# Patient Record
Sex: Male | Born: 1955 | Race: White | Marital: Married | Smoking: Current some day smoker
Health system: Southern US, Community
[De-identification: ages and names within clinical notes are randomized; demographics above are authoritative.]

## PROBLEM LIST (undated history)

## (undated) ENCOUNTER — Emergency Department (HOSPITAL_COMMUNITY): Disposition: A | Discharge status: Home / Self Care | Payer: BLUE CROSS/BLUE SHIELD

---

## 2017-12-05 ENCOUNTER — Other Ambulatory Visit: Payer: Self-pay | Admitting: Urology

## 2017-12-05 ENCOUNTER — Other Ambulatory Visit: Payer: Self-pay

## 2017-12-05 DIAGNOSIS — C61 Malignant neoplasm of prostate: Secondary | ICD-10-CM

## 2017-12-22 ENCOUNTER — Ambulatory Visit
Admission: RE | Admit: 2017-12-22 | Discharge: 2017-12-22 | Disposition: A | Discharge status: Home / Self Care | Payer: BLUE CROSS/BLUE SHIELD | Source: Ambulatory Visit | Attending: Urology | Admitting: Urology

## 2017-12-22 DIAGNOSIS — C61 Malignant neoplasm of prostate: Secondary | ICD-10-CM

## 2017-12-22 MED ORDER — GADOBENATE DIMEGLUMINE 529 MG/ML IV SOLN
16.0000 mL | Freq: Once | INTRAVENOUS | Status: AC | PRN
  Administered 2017-12-22: 16 mL via INTRAVENOUS

## 2019-04-09 IMAGING — MR MR PROSTATE WO/W CM
56 series · 56 of 56 positions shown · IV contrast (Multihance 16ml)
Comparison: None.

CLINICAL DATA: Gleason 3+3=6 lesion in the left mid gland involving
about 30% of submitted tissue on prior biopsy of 05/22/2015.
Elevated PSA level of 5.3.

EXAM:
MR PROSTATE WITHOUT AND WITH CONTRAST
TECHNIQUE: Multiplanar multisequence MRI images were obtained of the pelvis
centered about the prostate. Pre and post contrast images were
obtained.
CONTRAST:  16mL MULTIHANCE GADOBENATE DIMEGLUMINE 529 MG/ML IV SOLN
Creatinine was obtained on site at [HOSPITAL] at [HOSPITAL].
Results: Creatinine 1.1 mg/dL.

[Series 3: T1 · axial · 8.0mm · 1.06mm/px · 1 of 28 slices shown (1 of 2)]
[im 1/28]
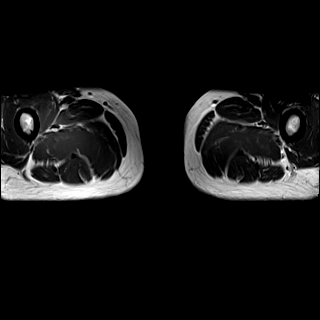

[Series 4: bSSFP fat-sat · axial · 8.0mm · 0.74mm/px · 1 of 28 slices shown]
[im 1/28]
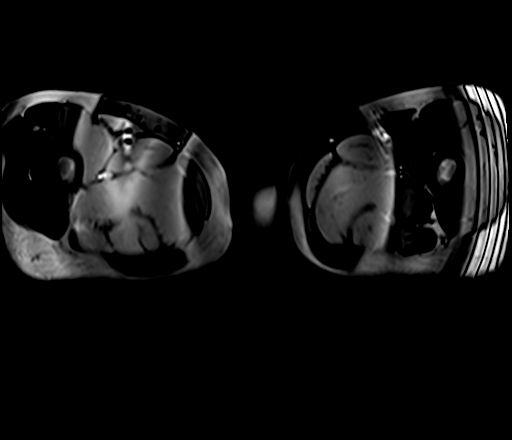

[Series 5: T2 · sagittal · 3.5mm · 0.56mm/px · 1 of 41 slices shown (1 of 4)]
[im 1/41]
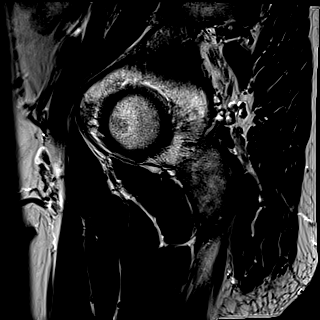

[Series 6: T1 · axial · 3.0mm · 0.31mm/px · 1 of 28 slices shown (2 of 2)]
[im 1/28]
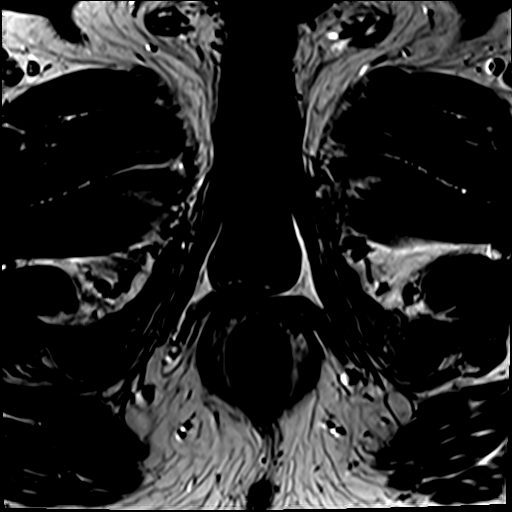

[Series 7: T2 · axial · 3.5mm · 0.56mm/px · 1 of 25 slices shown (2 of 4)]
[im 1/25]
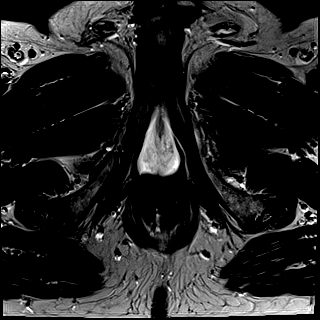

[Series 8: T2 · axial · 1.0mm · 1.04mm/px · 1 of 96 slices shown (3 of 4)]
[im 1/96]
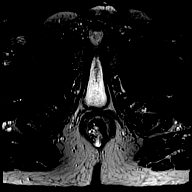

[Series 9: T2 · coronal · 3.5mm · 0.56mm/px · 1 of 31 slices shown (4 of 4)]
[im 1/31]
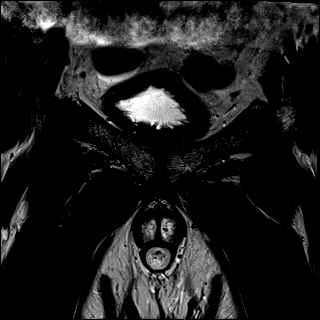

[Series 10: DWI · axial · 3.5mm · 1.56mm/px · 1 of 61 slices shown (1 of 2)]
[im 1/61]
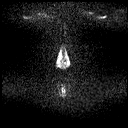

[Series 11: DWI · axial · 3.5mm · 1.56mm/px · 1 of 18 slices shown (2 of 2)]
[im 1/18]
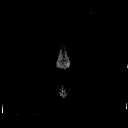

[Series 12: pre t1_twist_tra_dyn_ttc=6.4s · axial · non-contrast · 3.5mm · 0.83mm/px · 1 of 24 slices shown]
[im 1/24]
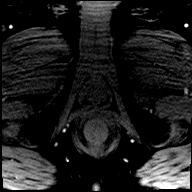

[Series 13: post t1_twist_tra_dyn-copy center · axial · 3.5mm · 0.83mm/px · 1 of 24 slices shown (1 of 24)]
[im 1/24]
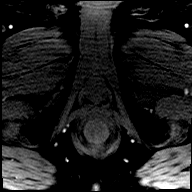

[Series 14: post t1_twist_tra_dyn-copy center · axial · 3.5mm · 0.83mm/px · 1 of 24 slices shown (2 of 24)]
[im 1/24]
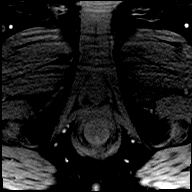

[Series 15: post t1_twist_tra_dyn-copy cent_sub_ttc=28.4s · axial · 3.5mm · 0.83mm/px · 1 of 24 slices shown]
[im 1/24]
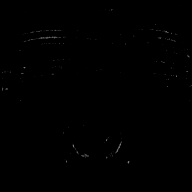

[Series 16: post t1_twist_tra_dyn-copy center · axial · 3.5mm · 0.83mm/px · 1 of 24 slices shown (3 of 24)]
[im 1/24]
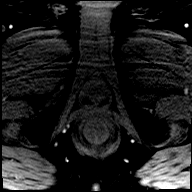

[Series 17: post t1_twist_tra_dyn-copy cent_sub_ttc=41.2s · axial · 3.5mm · 0.83mm/px · 1 of 22 slices shown]
[im 1/22]
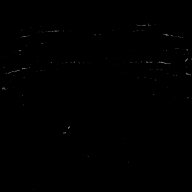

[Series 18: post t1_twist_tra_dyn-copy center · axial · 3.5mm · 0.83mm/px · 1 of 24 slices shown (4 of 24)]
[im 1/24]
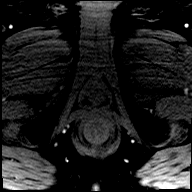

[Series 19: post t1_twist_tra_dyn-copy cent_sub_ttc=54.0s · axial · 3.5mm · 0.83mm/px · 1 of 24 slices shown]
[im 1/24]
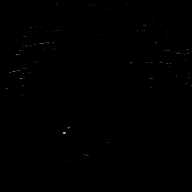

[Series 20: post t1_twist_tra_dyn-copy center · axial · 3.5mm · 0.83mm/px · 1 of 24 slices shown (5 of 24)]
[im 1/24]
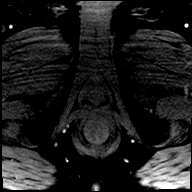

[Series 21: post t1_twist_tra_dyn-copy cent_sub_ttc=66.8s · axial · 3.5mm · 0.83mm/px · 1 of 24 slices shown]
[im 1/24]
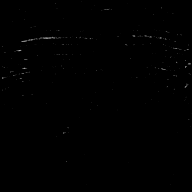

[Series 22: post t1_twist_tra_dyn-copy center · axial · 3.5mm · 0.83mm/px · 1 of 24 slices shown (6 of 24)]
[im 1/24]
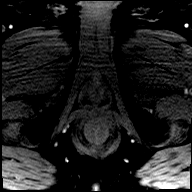

[Series 23: post t1_twist_tra_dyn-copy cent_sub_ttc=79.6s · axial · 3.5mm · 0.83mm/px · 1 of 24 slices shown]
[im 1/24]
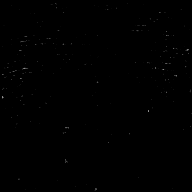

[Series 24: post t1_twist_tra_dyn-copy center · axial · 3.5mm · 0.83mm/px · 1 of 24 slices shown (7 of 24)]
[im 1/24]
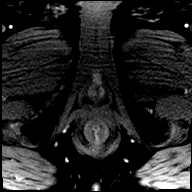

[Series 25: post t1_twist_tra_dyn-copy cent_sub_ttc=92.4s · axial · 3.5mm · 0.83mm/px · 1 of 24 slices shown]
[im 1/24]
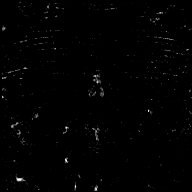

[Series 26: post t1_twist_tra_dyn-copy center · axial · 3.5mm · 0.83mm/px · 1 of 24 slices shown (8 of 24)]
[im 1/24]
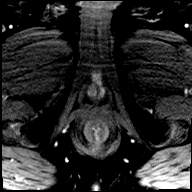

[Series 27: post t1_twist_tra_dyn-copy cent_sub_ttc=105.2s · axial · 3.5mm · 0.83mm/px · 1 of 24 slices shown]
[im 1/24]
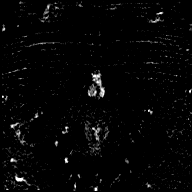

[Series 28: post t1_twist_tra_dyn-copy center · axial · 3.5mm · 0.83mm/px · 1 of 24 slices shown (9 of 24)]
[im 1/24]
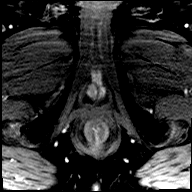

[Series 29: post t1_twist_tra_dyn-copy cent_sub_ttc=117.9s · axial · 3.5mm · 0.83mm/px · 1 of 23 slices shown]
[im 1/23]
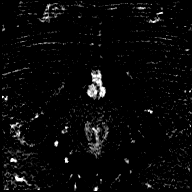

[Series 30: post t1_twist_tra_dyn-copy center · axial · 3.5mm · 0.83mm/px · 1 of 24 slices shown (10 of 24)]
[im 1/24]
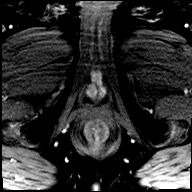

[Series 31: post t1_twist_tra_dyn-copy cent_sub_ttc=130.7s · axial · 3.5mm · 0.83mm/px · 1 of 24 slices shown]
[im 1/24]
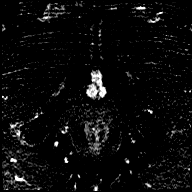

[Series 32: post t1_twist_tra_dyn-copy center · axial · 3.5mm · 0.83mm/px · 1 of 24 slices shown (11 of 24)]
[im 1/24]
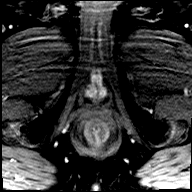

[Series 33: post t1_twist_tra_dyn-copy cent_sub_ttc=143.5s · axial · 3.5mm · 0.83mm/px · 1 of 24 slices shown]
[im 1/24]
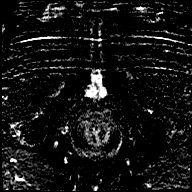

[Series 34: post t1_twist_tra_dyn-copy center · axial · 3.5mm · 0.83mm/px · 1 of 24 slices shown (12 of 24)]
[im 1/24]
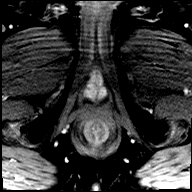

[Series 35: post t1_twist_tra_dyn-copy cent_sub_ttc=156.3s · axial · 3.5mm · 0.83mm/px · 1 of 24 slices shown]
[im 1/24]
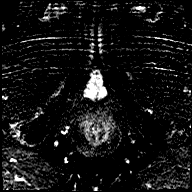

[Series 36: post t1_twist_tra_dyn-copy center · axial · 3.5mm · 0.83mm/px · 1 of 24 slices shown (13 of 24)]
[im 1/24]
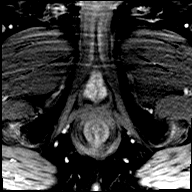

[Series 37: post t1_twist_tra_dyn-copy cent_sub_ttc=169.1s · axial · 3.5mm · 0.83mm/px · 1 of 23 slices shown]
[im 1/23]
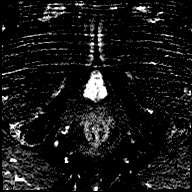

[Series 38: post t1_twist_tra_dyn-copy center · axial · 3.5mm · 0.83mm/px · 1 of 24 slices shown (14 of 24)]
[im 1/24]
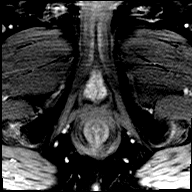

[Series 39: post t1_twist_tra_dyn-copy cent_sub_ttc=181.9s · axial · 3.5mm · 0.83mm/px · 1 of 24 slices shown]
[im 1/24]
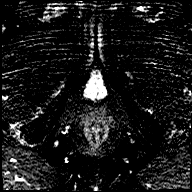

[Series 40: post t1_twist_tra_dyn-copy center · axial · 3.5mm · 0.83mm/px · 1 of 24 slices shown (15 of 24)]
[im 1/24]
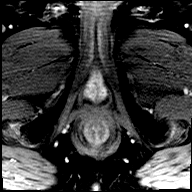

[Series 41: post t1_twist_tra_dyn-copy cent_sub_ttc=194.7s · axial · 3.5mm · 0.83mm/px · 1 of 24 slices shown]
[im 1/24]
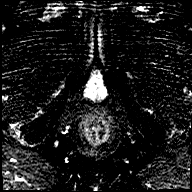

[Series 42: post t1_twist_tra_dyn-copy center · axial · 3.5mm · 0.83mm/px · 1 of 24 slices shown (16 of 24)]
[im 1/24]
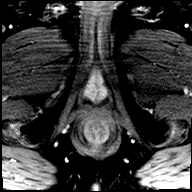

[Series 43: post t1_twist_tra_dyn-copy cent_sub_ttc=207.5s · axial · 3.5mm · 0.83mm/px · 1 of 24 slices shown]
[im 1/24]
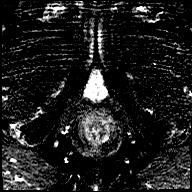

[Series 44: post t1_twist_tra_dyn-copy center · axial · 3.5mm · 0.83mm/px · 1 of 24 slices shown (17 of 24)]
[im 1/24]
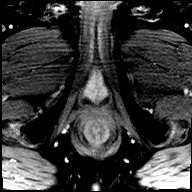

[Series 45: post t1_twist_tra_dyn-copy cent_sub_ttc=220.3s · axial · 3.5mm · 0.83mm/px · 1 of 24 slices shown]
[im 1/24]
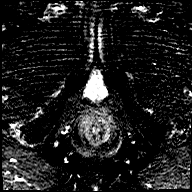

[Series 46: post t1_twist_tra_dyn-copy center · axial · 3.5mm · 0.83mm/px · 1 of 24 slices shown (18 of 24)]
[im 1/24]
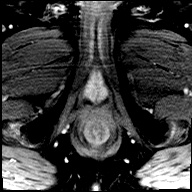

[Series 47: post t1_twist_tra_dyn-copy cent_sub_ttc=233.1s · axial · 3.5mm · 0.83mm/px · 1 of 24 slices shown]
[im 1/24]
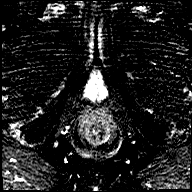

[Series 48: post t1_twist_tra_dyn-copy center · axial · 3.5mm · 0.83mm/px · 1 of 24 slices shown (19 of 24)]
[im 1/24]
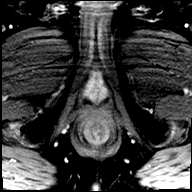

[Series 49: post t1_twist_tra_dyn-copy cent_sub_ttc=245.9s · axial · 3.5mm · 0.83mm/px · 1 of 24 slices shown]
[im 1/24]
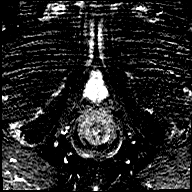

[Series 50: post t1_twist_tra_dyn-copy center · axial · 3.5mm · 0.83mm/px · 1 of 24 slices shown (20 of 24)]
[im 1/24]
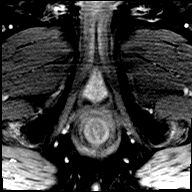

[Series 51: post t1_twist_tra_dyn-copy cent_sub_ttc=258.6s · axial · 3.5mm · 0.83mm/px · 1 of 24 slices shown]
[im 1/24]
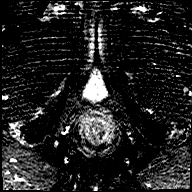

[Series 52: post t1_twist_tra_dyn-copy center · axial · 3.5mm · 0.83mm/px · 1 of 24 slices shown (21 of 24)]
[im 1/24]
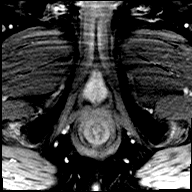

[Series 53: post t1_twist_tra_dyn-copy cent_sub_ttc=271.4s · axial · 3.5mm · 0.83mm/px · 1 of 24 slices shown]
[im 1/24]
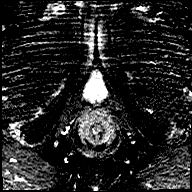

[Series 54: post t1_twist_tra_dyn-copy center · axial · 3.5mm · 0.83mm/px · 1 of 24 slices shown (22 of 24)]
[im 1/24]
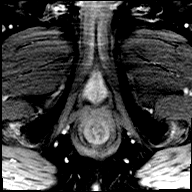

[Series 55: post t1_twist_tra_dyn-copy cent_sub_ttc=284.2s · axial · 3.5mm · 0.83mm/px · 1 of 24 slices shown]
[im 1/24]
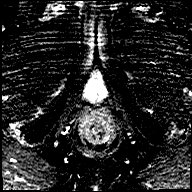

[Series 56: post t1_twist_tra_dyn-copy center · axial · 3.5mm · 0.83mm/px · 1 of 24 slices shown (23 of 24)]
[im 1/24]
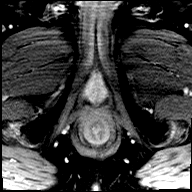

[Series 57: post t1_twist_tra_dyn-copy cent_sub_ttc=297.0s · axial · 3.5mm · 0.83mm/px · 1 of 24 slices shown]
[im 1/24]
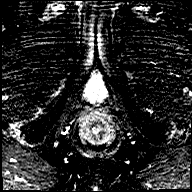

[Series 58: post t1_twist_tra_dyn-copy center · axial · 3.5mm · 0.83mm/px · 1 of 24 slices shown (24 of 24)]
[im 1/24]
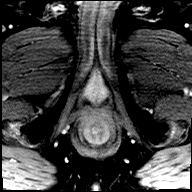

[56 of 56 positions shown; findings below may reference images not displayed]

FINDINGS: Prostate: Gas in the rectum is causing significant distortion and
artifact on the ADC map and diffusion images, and as a result,
increased emphasis is placed on the T2 weighted images in assessing
the peripheral zone.

A PI-RADS category 3 lesion of the left posterolateral mid gland and
left posterior base peripheral zone has mildly reduced indistinctly
marginated T2 signal characteristics but no appreciable dynamic
contrast enhancement. This lesion measures 1.9 by 1.4 by 0.7 cm and
targeting data is under region of interest # 1.

A PI-RADS category 3 lesion of the right posterior peripheral zone
at the base and right posterolateral peripheral zone in the mid
gland likewise has reduced T2 signal characteristics without
appreciable early contrast enhancement. This measures 0.61 cubic cm
(1.5 by 1.1 by 0.7 cm) and is labeled region of interest # 2 for
targeting purposes.

There is some subtle accentuated early contrast enhancement at the
left lateral apex peripheral zone but this does not have associated
focal reduced T2 signal and thus does not qualify as a specific
lesion.

Volume: 3D volumetric analysis: 42.10 cubic cm (5.5 by 4.0 by
cm).

Transcapsular spread:  Absent

Seminal vesicle involvement: Absent

Neurovascular bundle involvement: Absent

Pelvic adenopathy: Absent

Bone metastasis: Absent

Other findings: No supplemental non-categorized findings.
IMPRESSION: 1. PI-RADS category 3 lesions are present in the right
posterolateral and left posterolateral peripheral zone involving the
mid gland and prostate base, with targeting data regions of interest
saved to [facility] as noted above.
2. The prostate gland measures 42.1 cubic cm.
3. No adenopathy or evidence of bony metastatic disease.

## 2021-07-10 ENCOUNTER — Other Ambulatory Visit (HOSPITAL_COMMUNITY): Payer: Self-pay | Admitting: Cardiovascular Disease

## 2021-07-10 ENCOUNTER — Other Ambulatory Visit: Payer: Self-pay | Admitting: Cardiovascular Disease

## 2021-07-10 DIAGNOSIS — R0602 Shortness of breath: Secondary | ICD-10-CM

## 2021-07-20 ENCOUNTER — Telehealth (HOSPITAL_COMMUNITY): Payer: Self-pay | Admitting: Emergency Medicine

## 2021-07-20 NOTE — Telephone Encounter (Signed)
Reaching out to patient to offer assistance regarding upcoming cardiac imaging study; pt verbalizes understanding of appt date/time, parking situation and where to check in, pre-test NPO status and medications ordered, and verified current allergies; name and call back number provided for further questions should they arise ?Marchia Bond RN Navigator Cardiac Imaging ?[facility] ?[phone] office ?[phone] cell ? ? ?Arrival 830 ?Daily meds ? ?

## 2021-07-23 ENCOUNTER — Ambulatory Visit (HOSPITAL_COMMUNITY)
Admission: RE | Admit: 2021-07-23 | Discharge: 2021-07-23 | Disposition: A | Discharge status: Home / Self Care | Payer: Medicare HMO | Source: Ambulatory Visit | Attending: Family Medicine | Admitting: Family Medicine

## 2021-07-23 DIAGNOSIS — R0602 Shortness of breath: Secondary | ICD-10-CM | POA: Insufficient documentation

## 2021-07-23 DIAGNOSIS — I2510 Atherosclerotic heart disease of native coronary artery without angina pectoris: Secondary | ICD-10-CM | POA: Insufficient documentation

## 2021-07-23 MED ORDER — IOHEXOL 350 MG/ML SOLN
100.0000 mL | Freq: Once | INTRAVENOUS | Status: AC | PRN
Start: 2021-07-23 — End: 2021-07-23
  Administered 2021-07-23: 100 mL via INTRAVENOUS

## 2021-07-23 MED ORDER — NITROGLYCERIN 0.4 MG SL SUBL
SUBLINGUAL_TABLET | SUBLINGUAL | Status: AC
  Filled 2021-07-23: qty 2

## 2021-07-23 MED ORDER — NITROGLYCERIN 0.4 MG SL SUBL
0.8000 mg | SUBLINGUAL_TABLET | Freq: Once | SUBLINGUAL | Status: AC
  Administered 2021-07-23: 0.8 mg via SUBLINGUAL

## 2021-08-03 ENCOUNTER — Telehealth: Payer: Self-pay

## 2021-08-03 NOTE — Telephone Encounter (Signed)
NOTES SCANNED TO REFERRAL 

## 2021-08-14 ENCOUNTER — Encounter: Payer: Self-pay | Admitting: Cardiovascular Disease

## 2021-08-14 ENCOUNTER — Ambulatory Visit: Payer: Medicare HMO | Admitting: Cardiovascular Disease

## 2021-08-14 ENCOUNTER — Other Ambulatory Visit: Payer: Self-pay

## 2021-08-14 DIAGNOSIS — I2510 Atherosclerotic heart disease of native coronary artery without angina pectoris: Secondary | ICD-10-CM | POA: Insufficient documentation

## 2021-08-14 DIAGNOSIS — I10 Essential (primary) hypertension: Secondary | ICD-10-CM | POA: Diagnosis not present

## 2021-08-14 DIAGNOSIS — E782 Mixed hyperlipidemia: Secondary | ICD-10-CM | POA: Diagnosis not present

## 2021-08-14 DIAGNOSIS — E785 Hyperlipidemia, unspecified: Secondary | ICD-10-CM | POA: Insufficient documentation

## 2021-08-14 LAB — BASIC METABOLIC PANEL
BUN/Creatinine Ratio: 16 (ref 10–24)
BUN: 17 mg/dL (ref 8–27)
CO2: 22 mmol/L (ref 20–29)
Calcium: 10.2 mg/dL (ref 8.6–10.2)
Chloride: 100 mmol/L (ref 96–106)
Creatinine, Ser: 1.09 mg/dL (ref 0.76–1.27)
Glucose: 99 mg/dL (ref 70–99)
Potassium: 4.6 mmol/L (ref 3.5–5.2)
Sodium: 139 mmol/L (ref 134–144)
eGFR: 74 mL/min/{1.73_m2} (ref >59)

## 2021-08-14 LAB — CBC
Hematocrit: 40.4 % (ref 37.5–51.0)
Hemoglobin: 13.8 g/dL (ref 13.0–17.7)
MCH: 30.9 pg (ref 26.6–33.0)
MCHC: 34.2 g/dL (ref 31.5–35.7)
MCV: 91 fL (ref 79–97)
Platelets: 196 10*3/uL (ref 150–450)
RBC: 4.46 x10E6/uL (ref 4.14–5.80)
RDW: 12.7 % (ref 11.6–15.4)
WBC: 5.6 10*3/uL (ref 3.4–10.8)

## 2021-08-14 MED ORDER — SODIUM CHLORIDE 0.9% FLUSH: 3.0000 mL | Freq: Two times a day (BID) | INTRAVENOUS | Status: AC

## 2021-08-14 NOTE — Patient Instructions (Signed)
Medication Instructions:  Your physician recommends that you continue on your current medications as directed. Please refer to the Current Medication list given to you today.  *If you need a refill on your cardiac medications before your next appointment, please call your pharmacy*   Lab Work: Your physician recommends that you have labs drawn today: BMET & CBC  If you have labs (blood work) drawn today and your tests are completely normal, you will receive your results only by: [place] (if you have MyChart) OR A paper copy in the mail If you have any lab test that is abnormal or we need to change your treatment, we will call you to review the results.   Testing/Procedures: See below   Follow-Up: At [facility], you and your health needs are our priority.  As part of our continuing mission to provide you with exceptional heart care, we have created designated Provider Care Teams.  These Care Teams include your primary Cardiologist (physician) and Advanced Practice Providers (APPs -  Physician Assistants and Nurse Practitioners) who all work together to provide you with the care you need, when you need it.  We recommend signing up for the patient portal called "MyChart".  Sign up information is provided on this After Visit Summary.  MyChart is used to connect with patients for Virtual Visits (Telemedicine).  Patients are able to view lab/test results, encounter notes, upcoming appointments, etc.  Non-urgent messages can be sent to your provider as well.   To learn more about what you can do with MyChart, go to [email or web address].    Your next appointment:   2 week(s) after your procedure (6/1)  The format for your next appointment:   In Person  Provider:   None     Other Instructions  [address] [place] [address] Dept: [phone] Loc:  [place]  08/14/2021  You are scheduled for a Cardiac Catheterization on Thursday, June 1 with Dr. Quay Burow.  1. Please arrive at the Main Entrance A at [facility]: [place],  [address] at 9:30 AM (This time is two hours before your procedure to ensure your preparation). Free valet parking service is available.   Special note: Every effort is made to have your procedure done on time. Please understand that emergencies sometimes delay scheduled procedures.  2. Diet: Do not eat solid foods after midnight.  You may have clear liquids until 5 AM upon the day of the procedure.  3. Labs: You will need to have blood drawn today.   4. Medication instructions in preparation for your procedure:  On the morning of your procedure, take Aspirin and any morning medicines NOT listed above.  You may use sips of water.  5. Plan to go home the same day, you will only stay overnight if medically necessary. 6. You MUST have a responsible adult to drive you home. 7. An adult MUST be with you the first 24 hours after you arrive home. 8. Bring a current list of your medications, and the last time and date medication taken. 9. Bring ID and current insurance cards. 10.Please wear clothes that are easy to get on and off and wear slip-on shoes.  Thank you for allowing [place] to care for you!   -- [facility]

## 2021-08-14 NOTE — Assessment & Plan Note (Signed)
Xavier Carter was referred to me by Dr. Claudie Leach for cardiac catheterization because of a recent coronary CTA suggested a high-grade proximal LAD stenosis.  He does have risk factors notable for treated hypertension, diabetes and hyperlipidemia.  He smoked remotely.  He is completely asymptomatic.  He apparently had a normal nuclear stress test and 2D echo but a coronary CTA revealed a coronary calcium score of 427 with a severe proximal LAD stenosis.  Given the location I feel compelled to proceed with outpatient radial diagnostic coronary angiography.  I have reviewed the risks, indications, and alternatives to cardiac catheterization, possible angioplasty, and stenting with the patient. Risks include but are not limited to bleeding, infection, vascular injury, stroke, myocardial infection, arrhythmia, kidney injury, radiation-related injury in the case of prolonged fluoroscopy use, emergency cardiac surgery, and death. The patient understands the risks of serious complication is 1-2 in 5701 with diagnostic cardiac cath and 1-2% or less with angioplasty/stenting.

## 2021-08-14 NOTE — H&P (View-Only) (Signed)
   08/14/2021 Xavier Carter   10/02/1955  [id number]  Primary Physician Hufnagel, Christine, NP Primary Cardiologist: Jonathan J Berry MD FACP, FACC, FAHA, FSCAI  HPI:  Xavier Carter is a 66 y.o. thin-appearing married Latino male father of 2, grandfather of 2 grandchildren referred by Dr. Rosario, his cardiologist, for cardiac catheterization because of an abnormal coronary CTA.  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  This risk factors include remote tobacco abuse, treated hypertension, diabetes and hyperlipidemia.  Is never had heart attack or stroke.  He denies chest pain or shortness of breath.  He recently had a Myoview stress test that was low risk and nonischemic and a 2D echo that was normal as well.  He did have a coronary CTA that revealed a coronary calcium score of 427 with what appeared to be a high-grade lesion in his proximal LAD.  Because of the lesion location he was referred for diagnostic coronary angiography.   Current Meds  Medication Sig   amLODipine (NORVASC) 10 MG tablet Take 10 mg by mouth daily.   atorvastatin (LIPITOR) 20 MG tablet Take 1 tablet by mouth daily.   Blood Glucose Monitoring Suppl (ONE TOUCH ULTRA 2) w/Device KIT by Does not apply route.   carvedilol (COREG) 3.125 MG tablet Take 3.125 mg by mouth daily.   dapagliflozin propanediol (FARXIGA) 10 MG TABS tablet Take 1 tablet by mouth daily.   finasteride (PROSCAR) 5 MG tablet Take by mouth.   hydrocortisone (ANUSOL-HC) 2.5 % rectal cream Place rectally 2 (two) times daily.   Lancets (ONETOUCH DELICA PLUS LANCET33G) MISC daily.   Omega-3 Fatty Acids (FISH OIL) 1000 MG CAPS Take by mouth.   ONETOUCH ULTRA test strip 1 (ONE) STRIP ONCE A DAY   OZEMPIC, 0.25 OR 0.5 MG/DOSE, 2 MG/1.5ML SOPN Apply topically.   Vitamin D, Ergocalciferol, (DRISDOL) 1.25 MG (50000 UNIT) CAPS capsule Take 50,000 Units by mouth once a week.     Allergies  Allergen Reactions    Penicillins Hives, Other (See Comments) and Rash    Social History   Socioeconomic History   Marital status: Married    Spouse name: Not on file   Number of children: Not on file   Years of education: Not on file   Highest education level: Not on file  Occupational History   Not on file  Tobacco Use   Smoking status: Some Days    Types: Cigars   Smokeless tobacco: Never  Substance and Sexual Activity   Alcohol use: Not Currently    Comment: social settings   Drug use: Never   Sexual activity: Not on file  Other Topics Concern   Not on file  Social History Narrative   Not on file   Social Determinants of Health   Financial Resource Strain: Not on file  Food Insecurity: Not on file  Transportation Needs: Not on file  Physical Activity: Not on file  Stress: Not on file  Social Connections: Not on file  Intimate Partner Violence: Not on file     Review of Systems: General: negative for chills, fever, night sweats or weight changes.  Cardiovascular: negative for chest pain, dyspnea on exertion, edema, orthopnea, palpitations, paroxysmal nocturnal dyspnea or shortness of breath Dermatological: negative for rash Respiratory: negative for cough or wheezing Urologic: negative for hematuria Abdominal: negative for nausea, vomiting, diarrhea, bright red blood per rectum, melena, or hematemesis Neurologic: negative for visual changes, syncope, or dizziness All   other systems reviewed and are otherwise negative except as noted above.    Blood pressure 134/72, pulse (!) 56, height 5' 11" (1.803 m), weight 170 lb 6.4 oz (77.3 kg).  General appearance: alert and no distress Neck: no adenopathy, no carotid bruit, no JVD, supple, symmetrical, trachea midline, and thyroid not enlarged, symmetric, no tenderness/mass/nodules Lungs: clear to auscultation bilaterally Heart: regular rate and rhythm, S1, S2 normal, no murmur, click, rub or gallop Extremities: extremities normal,  atraumatic, no cyanosis or edema Pulses: 2+ and symmetric Skin: Skin color, texture, turgor normal. No rashes or lesions Neurologic: Grossly normal  EKG sinus bradycardia 56 without ST or T wave changes.  Personally reviewed this EKG.  ASSESSMENT AND PLAN:   Coronary artery disease Xavier Carter was referred to me by Dr. Rosario for cardiac catheterization because of a recent coronary CTA suggested a high-grade proximal LAD stenosis.  He does have risk factors notable for treated hypertension, diabetes and hyperlipidemia.  He smoked remotely.  He is completely asymptomatic.  He apparently had a normal nuclear stress test and 2D echo but a coronary CTA revealed a coronary calcium score of 427 with a severe proximal LAD stenosis.  Given the location I feel compelled to proceed with outpatient radial diagnostic coronary angiography.   I have reviewed the risks, indications, and alternatives to cardiac catheterization, possible angioplasty, and stenting with the patient. Risks include but are not limited to bleeding, infection, vascular injury, stroke, myocardial infection, arrhythmia, kidney injury, radiation-related injury in the case of prolonged fluoroscopy use, emergency cardiac surgery, and death. The patient understands the risks of serious complication is 1-2 in 1000 with diagnostic cardiac cath and 1-2% or less with angioplasty/stenting.      Jonathan J. Berry MD FACP,FACC,FAHA, FSCAI 08/14/2021 10:17 AM 

## 2021-08-14 NOTE — Progress Notes (Signed)
   08/14/2021 Xavier Carter   07/05/1955  [id number]  Primary Physician Hufnagel, Christine, NP Primary Cardiologist: Lavarr President J Dev Dhondt MD FACP, FACC, FAHA, FSCAI  HPI:  Xavier Carter is a 66 y.o. thin-appearing married Latino male father of 2, grandfather of 2 grandchildren referred by Dr. Rosario, his cardiologist, for cardiac catheterization because of an abnormal coronary CTA.  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  This risk factors include remote tobacco abuse, treated hypertension, diabetes and hyperlipidemia.  Is never had heart attack or stroke.  He denies chest pain or shortness of breath.  He recently had a Myoview stress test that was low risk and nonischemic and a 2D echo that was normal as well.  He did have a coronary CTA that revealed a coronary calcium score of 427 with what appeared to be a high-grade lesion in his proximal LAD.  Because of the lesion location he was referred for diagnostic coronary angiography.   Current Meds  Medication Sig   amLODipine (NORVASC) 10 MG tablet Take 10 mg by mouth daily.   atorvastatin (LIPITOR) 20 MG tablet Take 1 tablet by mouth daily.   Blood Glucose Monitoring Suppl (ONE TOUCH ULTRA 2) w/Device KIT by Does not apply route.   carvedilol (COREG) 3.125 MG tablet Take 3.125 mg by mouth daily.   dapagliflozin propanediol (FARXIGA) 10 MG TABS tablet Take 1 tablet by mouth daily.   finasteride (PROSCAR) 5 MG tablet Take by mouth.   hydrocortisone (ANUSOL-HC) 2.5 % rectal cream Place rectally 2 (two) times daily.   Lancets (ONETOUCH DELICA PLUS LANCET33G) MISC daily.   Omega-3 Fatty Acids (FISH OIL) 1000 MG CAPS Take by mouth.   ONETOUCH ULTRA test strip 1 (ONE) STRIP ONCE A DAY   OZEMPIC, 0.25 OR 0.5 MG/DOSE, 2 MG/1.5ML SOPN Apply topically.   Vitamin D, Ergocalciferol, (DRISDOL) 1.25 MG (50000 UNIT) CAPS capsule Take 50,000 Units by mouth once a week.     Allergies  Allergen Reactions    Penicillins Hives, Other (See Comments) and Rash    Social History   Socioeconomic History   Marital status: Married    Spouse name: Not on file   Number of children: Not on file   Years of education: Not on file   Highest education level: Not on file  Occupational History   Not on file  Tobacco Use   Smoking status: Some Days    Types: Cigars   Smokeless tobacco: Never  Substance and Sexual Activity   Alcohol use: Not Currently    Comment: social settings   Drug use: Never   Sexual activity: Not on file  Other Topics Concern   Not on file  Social History Narrative   Not on file   Social Determinants of Health   Financial Resource Strain: Not on file  Food Insecurity: Not on file  Transportation Needs: Not on file  Physical Activity: Not on file  Stress: Not on file  Social Connections: Not on file  Intimate Partner Violence: Not on file     Review of Systems: General: negative for chills, fever, night sweats or weight changes.  Cardiovascular: negative for chest pain, dyspnea on exertion, edema, orthopnea, palpitations, paroxysmal nocturnal dyspnea or shortness of breath Dermatological: negative for rash Respiratory: negative for cough or wheezing Urologic: negative for hematuria Abdominal: negative for nausea, vomiting, diarrhea, bright red blood per rectum, melena, or hematemesis Neurologic: negative for visual changes, syncope, or dizziness All   other systems reviewed and are otherwise negative except as noted above.    Blood pressure 134/72, pulse (!) 56, height 5' 11" (1.803 m), weight 170 lb 6.4 oz (77.3 kg).  General appearance: alert and no distress Neck: no adenopathy, no carotid bruit, no JVD, supple, symmetrical, trachea midline, and thyroid not enlarged, symmetric, no tenderness/mass/nodules Lungs: clear to auscultation bilaterally Heart: regular rate and rhythm, S1, S2 normal, no murmur, click, rub or gallop Extremities: extremities normal,  atraumatic, no cyanosis or edema Pulses: 2+ and symmetric Skin: Skin color, texture, turgor normal. No rashes or lesions Neurologic: Grossly normal  EKG sinus bradycardia 56 without ST or T wave changes.  Personally reviewed this EKG.  ASSESSMENT AND PLAN:   Coronary artery disease Mr. Gurski was referred to me by Dr. Rosario for cardiac catheterization because of a recent coronary CTA suggested a high-grade proximal LAD stenosis.  He does have risk factors notable for treated hypertension, diabetes and hyperlipidemia.  He smoked remotely.  He is completely asymptomatic.  He apparently had a normal nuclear stress test and 2D echo but a coronary CTA revealed a coronary calcium score of 427 with a severe proximal LAD stenosis.  Given the location I feel compelled to proceed with outpatient radial diagnostic coronary angiography.   I have reviewed the risks, indications, and alternatives to cardiac catheterization, possible angioplasty, and stenting with the patient. Risks include but are not limited to bleeding, infection, vascular injury, stroke, myocardial infection, arrhythmia, kidney injury, radiation-related injury in the case of prolonged fluoroscopy use, emergency cardiac surgery, and death. The patient understands the risks of serious complication is 1-2 in 1000 with diagnostic cardiac cath and 1-2% or less with angioplasty/stenting.      Gilma Bessette J. Jaelan Rasheed MD FACP,FACC,FAHA, FSCAI 08/14/2021 10:17 AM 

## 2021-08-17 ENCOUNTER — Telehealth: Payer: Self-pay

## 2021-08-17 NOTE — Telephone Encounter (Signed)
Spoke with pt's wife regarding labs work and to change date of heart cath d/t Dr. Kennon Holter family emergency. Date of cath changed to 6/5. Wife verbalizes understanding.

## 2021-08-27 ENCOUNTER — Ambulatory Visit (HOSPITAL_COMMUNITY)
Admission: RE | Admit: 2021-08-27 | Discharge: 2021-08-27 | Disposition: A | Discharge status: Home / Self Care | Payer: Medicare HMO | Attending: Cardiovascular Disease | Admitting: Cardiovascular Disease

## 2021-08-27 ENCOUNTER — Other Ambulatory Visit: Payer: Self-pay

## 2021-08-27 ENCOUNTER — Encounter (HOSPITAL_COMMUNITY)
Admission: RE | Disposition: A | Discharge status: Home / Self Care | Payer: Self-pay | Source: Home / Self Care | Attending: Cardiovascular Disease

## 2021-08-27 ENCOUNTER — Encounter (HOSPITAL_COMMUNITY): Payer: Self-pay | Admitting: Cardiovascular Disease

## 2021-08-27 DIAGNOSIS — Z7985 Long-term (current) use of injectable non-insulin antidiabetic drugs: Secondary | ICD-10-CM | POA: Diagnosis not present

## 2021-08-27 DIAGNOSIS — I2510 Atherosclerotic heart disease of native coronary artery without angina pectoris: Secondary | ICD-10-CM | POA: Insufficient documentation

## 2021-08-27 DIAGNOSIS — Z87891 Personal history of nicotine dependence: Secondary | ICD-10-CM | POA: Diagnosis not present

## 2021-08-27 DIAGNOSIS — E785 Hyperlipidemia, unspecified: Secondary | ICD-10-CM | POA: Diagnosis not present

## 2021-08-27 DIAGNOSIS — E119 Type 2 diabetes mellitus without complications: Secondary | ICD-10-CM | POA: Insufficient documentation

## 2021-08-27 DIAGNOSIS — I10 Essential (primary) hypertension: Secondary | ICD-10-CM | POA: Insufficient documentation

## 2021-08-27 HISTORY — PX: LEFT HEART CATH AND CORONARY ANGIOGRAPHY: CATH118249

## 2021-08-27 LAB — GLUCOSE, CAPILLARY
Glucose-Capillary: 114 mg/dL — ABNORMAL HIGH (ref 70–99)
Glucose-Capillary: 116 mg/dL — ABNORMAL HIGH (ref 70–99)

## 2021-08-27 SURGERY — LEFT HEART CATH AND CORONARY ANGIOGRAPHY
Anesthesia: LOCAL

## 2021-08-27 MED ORDER — MORPHINE SULFATE (PF) 2 MG/ML IV SOLN: 2.0000 mg | INTRAVENOUS | Status: DC | PRN

## 2021-08-27 MED ORDER — SODIUM CHLORIDE 0.9 % WEIGHT BASED INFUSION: 1.0000 mL/kg/h | INTRAVENOUS | Status: DC

## 2021-08-27 MED ORDER — ASPIRIN 81 MG PO CHEW: 81.0000 mg | CHEWABLE_TABLET | Freq: Every day | ORAL | Status: DC

## 2021-08-27 MED ORDER — CARVEDILOL 3.125 MG PO TABS: 3.1250 mg | ORAL_TABLET | Freq: Two times a day (BID) | ORAL | Status: DC

## 2021-08-27 MED ORDER — VERAPAMIL HCL 2.5 MG/ML IV SOLN
INTRA_ARTERIAL | Status: DC | PRN
  Administered 2021-08-27: 5 mL via INTRA_ARTERIAL

## 2021-08-27 MED ORDER — ACETAMINOPHEN 325 MG PO TABS: 650.0000 mg | ORAL_TABLET | ORAL | Status: DC | PRN

## 2021-08-27 MED ORDER — HEPARIN SODIUM (PORCINE) 1000 UNIT/ML IJ SOLN
INTRAMUSCULAR | Status: DC | PRN
  Administered 2021-08-27: 4000 [IU] via INTRAVENOUS

## 2021-08-27 MED ORDER — ASPIRIN 81 MG PO CHEW: 81.0000 mg | CHEWABLE_TABLET | ORAL | Status: DC

## 2021-08-27 MED ORDER — DAPAGLIFLOZIN PROPANEDIOL 10 MG PO TABS: 10.0000 mg | ORAL_TABLET | Freq: Every day | ORAL | Status: DC

## 2021-08-27 MED ORDER — FINASTERIDE 5 MG PO TABS: 5.0000 mg | ORAL_TABLET | Freq: Every day | ORAL | Status: DC

## 2021-08-27 MED ORDER — AMLODIPINE BESYLATE 5 MG PO TABS
10.0000 mg | ORAL_TABLET | Freq: Every day | ORAL | Status: DC
Start: 2021-08-27 — End: 2021-08-27

## 2021-08-27 MED ORDER — ASPIRIN 81 MG PO TBEC
81.0000 mg | DELAYED_RELEASE_TABLET | Freq: Every day | ORAL | Status: DC
Start: 2021-08-27 — End: 2021-08-27

## 2021-08-27 MED ORDER — SODIUM CHLORIDE 0.9% FLUSH: 3.0000 mL | INTRAVENOUS | Status: DC | PRN

## 2021-08-27 MED ORDER — LABETALOL HCL 5 MG/ML IV SOLN: 10.0000 mg | INTRAVENOUS | Status: DC | PRN

## 2021-08-27 MED ORDER — HEPARIN (PORCINE) IN NACL 1000-0.9 UT/500ML-% IV SOLN
INTRAVENOUS | Status: DC | PRN
  Administered 2021-08-27 (×2): 500 mL

## 2021-08-27 MED ORDER — SODIUM CHLORIDE 0.9 % IV SOLN: 250.0000 mL | INTRAVENOUS | Status: DC | PRN

## 2021-08-27 MED ORDER — HYDRALAZINE HCL 20 MG/ML IJ SOLN: 10.0000 mg | INTRAMUSCULAR | Status: DC | PRN

## 2021-08-27 MED ORDER — SODIUM CHLORIDE 0.9% FLUSH: 3.0000 mL | Freq: Two times a day (BID) | INTRAVENOUS | Status: DC

## 2021-08-27 MED ORDER — ONDANSETRON HCL 4 MG/2ML IJ SOLN: 4.0000 mg | Freq: Four times a day (QID) | INTRAMUSCULAR | Status: DC | PRN

## 2021-08-27 MED ORDER — SODIUM CHLORIDE 0.9 % IV SOLN: INTRAVENOUS | Status: DC

## 2021-08-27 MED ORDER — SODIUM CHLORIDE 0.9 % WEIGHT BASED INFUSION
3.0000 mL/kg/h | INTRAVENOUS | Status: AC
  Administered 2021-08-27: 3 mL/kg/h via INTRAVENOUS

## 2021-08-27 MED ORDER — IOHEXOL 350 MG/ML SOLN
INTRAVENOUS | Status: DC | PRN
  Administered 2021-08-27: 55 mL

## 2021-08-27 MED ORDER — ATORVASTATIN CALCIUM 10 MG PO TABS: 20.0000 mg | ORAL_TABLET | Freq: Every day | ORAL | Status: DC

## 2021-08-27 MED ORDER — LIDOCAINE HCL (PF) 1 % IJ SOLN
INTRAMUSCULAR | Status: DC | PRN
  Administered 2021-08-27: 2 mL

## 2021-08-27 SURGICAL SUPPLY — 11 items
BAND ZEPHYR COMPRESS 30 LONG (HEMOSTASIS) ×1 IMPLANT
CATH INFINITI JR4 5F (CATHETERS) ×1 IMPLANT
CATH OPTITORQUE TIG 4.0 5F (CATHETERS) ×1 IMPLANT
GLIDESHEATH SLEND A-KIT 6F 22G (SHEATH) ×1 IMPLANT
GUIDEWIRE INQWIRE 1.5J.035X260 (WIRE) IMPLANT
INQWIRE 1.5J .035X260CM (WIRE) ×2
KIT HEART LEFT (KITS) ×2 IMPLANT
PACK CARDIAC CATHETERIZATION (CUSTOM PROCEDURE TRAY) ×2 IMPLANT
TRANSDUCER W/STOPCOCK (MISCELLANEOUS) ×2 IMPLANT
TUBING CIL FLEX 10 FLL-RA (TUBING) ×2 IMPLANT
WIRE HI TORQ VERSACORE-J 145CM (WIRE) ×1 IMPLANT

## 2021-08-27 NOTE — Interval H&P Note (Signed)
Cath Lab Visit (complete for each Cath Lab visit)  Clinical Evaluation Leading to the Procedure:   ACS: No.  Non-ACS:    Anginal Classification: No Symptoms  Anti-ischemic medical therapy: Maximal Therapy (2 or more classes of medications)  Non-Invasive Test Results: Low-risk stress test findings: cardiac mortality <1%/year  Prior CABG: No previous CABG      History and Physical Interval Note:  08/27/2021 10:10 AM  Xavier Carter  has presented today for surgery, with the diagnosis of cad.  The various methods of treatment have been discussed with the patient and family. After consideration of risks, benefits and other options for treatment, the patient has consented to  Procedure(s): LEFT HEART CATH AND CORONARY ANGIOGRAPHY (N/A) as a surgical intervention.  The patient's history has been reviewed, patient examined, no change in status, stable for surgery.  I have reviewed the patient's chart and labs.  Questions were answered to the patient's satisfaction.     Quay Burow

## 2021-09-11 ENCOUNTER — Ambulatory Visit: Payer: Medicare HMO | Admitting: Cardiovascular Disease

## 2021-12-04 ENCOUNTER — Encounter: Payer: Self-pay | Admitting: Cardiovascular Disease

## 2021-12-04 ENCOUNTER — Ambulatory Visit: Payer: Medicare HMO | Attending: Cardiovascular Disease | Admitting: Cardiovascular Disease

## 2021-12-04 DIAGNOSIS — I2510 Atherosclerotic heart disease of native coronary artery without angina pectoris: Secondary | ICD-10-CM

## 2021-12-04 NOTE — Patient Instructions (Signed)
Medication Instructions:  Your physician recommends that you continue on your current medications as directed. Please refer to the Current Medication list given to you today.  *If you need a refill on your cardiac medications before your next appointment, please call your pharmacy*   Follow-Up: At [facility], you and your health needs are our priority.  As part of our continuing mission to provide you with exceptional heart care, we have created designated Provider Care Teams.  These Care Teams include your primary Cardiologist (physician) and Advanced Practice Providers (APPs -  Physician Assistants and Nurse Practitioners) who all work together to provide you with the care you need, when you need it.  We recommend signing up for the patient portal called "MyChart".  Sign up information is provided on this After Visit Summary.  MyChart is used to connect with patients for Virtual Visits (Telemedicine).  Patients are able to view lab/test results, encounter notes, upcoming appointments, etc.  Non-urgent messages can be sent to your provider as well.   To learn more about what you can do with MyChart, go to [email or web address].    Your next appointment:   We will see you on an as needed basis.  Provider:   Jonathan Berry, MD  

## 2021-12-04 NOTE — Assessment & Plan Note (Signed)
Xavier Carter was referred to me by Dr. Claudie Leach because of an abnormal coronary CTA with a coronary calcium score of 427 and suggestion of severe proximal LAD disease.  Based on this I performed outpatient radial diagnostic cath on him 08/27/2021 that showed at most a 30% proximal LAD stenosis and a 70% mid tubular RCA stenosis in a nondominant vessel.  He is completely asymptomatic.  The remainder of his risk factors are managed by Dr. Claudie Leach.

## 2021-12-04 NOTE — Progress Notes (Signed)
12/04/2021 Xavier Carter   06/19/1955  [id number]  Primary Physician Xavier Chew, NP Primary Cardiologist: Lorretta Harp MD Xavier Carter, [place]  HPI:  Xavier Carter is a 66 y.o.  thin-appearing married Latino male father of 2, grandfather of 2 grandchildren referred by Dr. Claudie Leach, his cardiologist, for cardiac catheterization because of an abnormal coronary CTA.  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  I last saw him in the office 08/14/2021.  This risk factors include remote tobacco abuse, treated hypertension, diabetes and hyperlipidemia.  Is never had heart attack or stroke.  He denies chest pain or shortness of breath.  He recently had a Myoview stress test that was low risk and nonischemic and a 2D echo that was normal as well.  He did have a coronary CTA that revealed a coronary calcium score of 427 with what appeared to be a high-grade lesion in his proximal LAD.  Because of the lesion location he was referred for diagnostic coronary angiography.  I performed outpatient radial diagnostic cath on him 08/27/2021 showing at most a 30% proximal LAD stenosis and a left dominant system.  He had a 70% tubular mid RCA stenosis in a nondominant vessel.  He is completely asymptomatic.  The remainder of his risk factors included hypertension, diabetes and hyperlipidemia are managed by Dr. Claudie Leach.     Current Meds  Medication Sig   amLODipine (NORVASC) 10 MG tablet Take 10 mg by mouth daily.   atorvastatin (LIPITOR) 20 MG tablet Take 1 tablet by mouth daily.   Blood Glucose Monitoring Suppl (ONE TOUCH ULTRA 2) w/Device KIT by Does not apply route.   Carboxymethylcellul-Glycerin (LUBRICATING EYE DROPS OP) Place 1 drop into both eyes daily as needed (dry eyes).   carvedilol (COREG) 3.125 MG tablet Take 3.125 mg by mouth 2 (two) times daily.   dapagliflozin propanediol (FARXIGA) 10 MG TABS tablet Take 1 tablet by mouth daily.    finasteride (PROSCAR) 5 MG tablet Take 5 mg by mouth daily.   Lancets (ONETOUCH DELICA PLUS [id number]) MISC daily.   omega-3 acid ethyl esters (LOVAZA) 1 g capsule Take 1 g by mouth 2 (two) times daily.   ONETOUCH ULTRA test strip 1 (ONE) STRIP ONCE A DAY   OZEMPIC, 0.25 OR 0.5 MG/DOSE, 2 MG/1.5ML SOPN Inject 0.25 mg as directed every Wednesday.   Vitamin D, Ergocalciferol, (DRISDOL) 1.25 MG (50000 UNIT) CAPS capsule Take 50,000 Units by mouth every Wednesday.   Current Facility-Administered Medications for the 12/04/21 encounter (Office Visit) with Lorretta Harp, MD  Medication   sodium chloride flush (NS) 0.9 % injection 3 mL     Allergies  Allergen Reactions   Penicillins Hives and Rash    Social History   Socioeconomic History   Marital status: Married    Spouse name: Not on file   Number of children: Not on file   Years of education: Not on file   Highest education level: Not on file  Occupational History   Not on file  Tobacco Use   Smoking status: Some Days    Types: Cigars   Smokeless tobacco: Never  Substance and Sexual Activity   Alcohol use: Not Currently    Comment: social settings   Drug use: Never   Sexual activity: Not on file  Other Topics Concern   Not on file  Social History Narrative   Not on file   Social Determinants of Health  Financial Resource Strain: Not on file  Food Insecurity: Not on file  Transportation Needs: Not on file  Physical Activity: Not on file  Stress: Not on file  Social Connections: Not on file  Intimate Partner Violence: Not on file     Review of Systems: General: negative for chills, fever, night sweats or weight changes.  Cardiovascular: negative for chest pain, dyspnea on exertion, edema, orthopnea, palpitations, paroxysmal nocturnal dyspnea or shortness of breath Dermatological: negative for rash Respiratory: negative for cough or wheezing Urologic: negative for hematuria Abdominal: negative for nausea,  vomiting, diarrhea, bright red blood per rectum, melena, or hematemesis Neurologic: negative for visual changes, syncope, or dizziness All other systems reviewed and are otherwise negative except as noted above.    Blood pressure 126/64, pulse 74, height $RemoveBe'5\' 11"'vYbyaMGjO$  (1.803 m), weight 173 lb 6.4 oz (78.7 kg), SpO2 97 %.  General appearance: alert and no distress Neck: no adenopathy, no carotid bruit, no JVD, supple, symmetrical, trachea midline, and thyroid not enlarged, symmetric, no tenderness/mass/nodules Lungs: clear to auscultation bilaterally Heart: regular rate and rhythm, S1, S2 normal, no murmur, click, rub or gallop Extremities: extremities normal, atraumatic, no cyanosis or edema Pulses: 2+ and symmetric Skin: Skin color, texture, turgor normal. No rashes or lesions Neurologic: Grossly normal  EKG not performed today  ASSESSMENT AND PLAN:   Coronary artery disease Mr. Huntley was referred to me by Dr. Claudie Leach because of an abnormal coronary CTA with a coronary calcium score of 427 and suggestion of severe proximal LAD disease.  Based on this I performed outpatient radial diagnostic cath on him 08/27/2021 that showed at most a 30% proximal LAD stenosis and a 70% mid tubular RCA stenosis in a nondominant vessel.  He is completely asymptomatic.  The remainder of his risk factors are managed by Dr. Claudie Leach.     Lorretta Harp MD FACP,FACC,FAHA, [facility] 12/04/2021 2:27 PM
